# Patient Record
Sex: Female | Born: 1935 | Race: White | Hispanic: No
Health system: Southern US, Community
[De-identification: ages and names within clinical notes are randomized; demographics above are authoritative.]

---

## 1970-03-23 HISTORY — PX: BREAST EXCISIONAL BIOPSY: SUR124

## 2004-01-25 ENCOUNTER — Ambulatory Visit: Payer: Self-pay | Admitting: Gastroenterology

## 2004-05-07 ENCOUNTER — Ambulatory Visit: Payer: Self-pay | Admitting: Internal Medicine

## 2005-05-20 ENCOUNTER — Ambulatory Visit: Payer: Self-pay | Admitting: Internal Medicine

## 2006-01-27 ENCOUNTER — Ambulatory Visit: Payer: Self-pay | Admitting: Ophthalmology

## 2006-05-24 ENCOUNTER — Ambulatory Visit: Payer: Self-pay | Admitting: Internal Medicine

## 2007-07-20 ENCOUNTER — Ambulatory Visit: Payer: Self-pay | Admitting: Internal Medicine

## 2008-08-14 ENCOUNTER — Ambulatory Visit: Payer: Self-pay | Admitting: Internal Medicine

## 2008-08-22 ENCOUNTER — Emergency Department: Payer: Self-pay | Admitting: Internal Medicine

## 2009-09-16 ENCOUNTER — Ambulatory Visit: Payer: Self-pay | Admitting: Internal Medicine

## 2010-09-18 ENCOUNTER — Ambulatory Visit: Payer: Self-pay | Admitting: Internal Medicine

## 2010-10-28 ENCOUNTER — Ambulatory Visit: Payer: Self-pay | Admitting: Internal Medicine

## 2010-11-13 ENCOUNTER — Ambulatory Visit: Payer: Self-pay | Admitting: General Surgery

## 2010-11-14 LAB — PATHOLOGY REPORT

## 2011-09-21 ENCOUNTER — Ambulatory Visit: Payer: Self-pay | Admitting: Internal Medicine

## 2012-09-21 ENCOUNTER — Ambulatory Visit: Payer: Self-pay | Admitting: Internal Medicine

## 2013-08-11 ENCOUNTER — Emergency Department: Payer: Self-pay | Admitting: Emergency Medicine

## 2013-08-11 LAB — CBC WITH DIFFERENTIAL/PLATELET
BASOS ABS: 0 10*3/uL (ref 0.0–0.1)
Basophil %: 0.5 %
EOS PCT: 0.6 %
Eosinophil #: 0 10*3/uL (ref 0.0–0.7)
HCT: 42.3 % (ref 35.0–47.0)
HGB: 14.1 g/dL (ref 12.0–16.0)
Lymphocyte #: 0.8 10*3/uL — ABNORMAL LOW (ref 1.0–3.6)
Lymphocyte %: 12.4 %
MCH: 30.7 pg (ref 26.0–34.0)
MCHC: 33.2 g/dL (ref 32.0–36.0)
MCV: 93 fL (ref 80–100)
Monocyte #: 0.4 x10 3/mm (ref 0.2–0.9)
Monocyte %: 5.7 %
NEUTROS PCT: 80.8 %
Neutrophil #: 5.1 10*3/uL (ref 1.4–6.5)
PLATELETS: 201 10*3/uL (ref 150–440)
RBC: 4.58 10*6/uL (ref 3.80–5.20)
RDW: 14.3 % (ref 11.5–14.5)
WBC: 6.3 10*3/uL (ref 3.6–11.0)

## 2013-08-11 LAB — COMPREHENSIVE METABOLIC PANEL
ALK PHOS: 78 U/L
ALT: 18 U/L (ref 12–78)
Albumin: 3.1 g/dL — ABNORMAL LOW (ref 3.4–5.0)
Anion Gap: 4 — ABNORMAL LOW (ref 7–16)
BUN: 10 mg/dL (ref 7–18)
Bilirubin,Total: 0.6 mg/dL (ref 0.2–1.0)
CO2: 25 mmol/L (ref 21–32)
Calcium, Total: 8.4 mg/dL — ABNORMAL LOW (ref 8.5–10.1)
Chloride: 110 mmol/L — ABNORMAL HIGH (ref 98–107)
Creatinine: 0.62 mg/dL (ref 0.60–1.30)
EGFR (African American): >60
EGFR (Non-African Amer.): >60
Glucose: 107 mg/dL — ABNORMAL HIGH (ref 65–99)
Osmolality: 277 (ref 275–301)
POTASSIUM: 4.7 mmol/L (ref 3.5–5.1)
SGOT(AST): 51 U/L — ABNORMAL HIGH (ref 15–37)
Sodium: 139 mmol/L (ref 136–145)
TOTAL PROTEIN: 6.5 g/dL (ref 6.4–8.2)

## 2013-08-11 LAB — URINALYSIS, COMPLETE
Bacteria: NONE SEEN
Bilirubin,UR: NEGATIVE
GLUCOSE, UR: NEGATIVE mg/dL (ref 0–75)
Ketone: NEGATIVE
Leukocyte Esterase: NEGATIVE
Nitrite: NEGATIVE
PH: 7 (ref 4.5–8.0)
PROTEIN: NEGATIVE
RBC,UR: 1 /HPF (ref 0–5)
SPECIFIC GRAVITY: 1.008 (ref 1.003–1.030)
Squamous Epithelial: <1

## 2013-08-11 LAB — TROPONIN I: Troponin-I: <0.02 ng/mL

## 2013-08-11 LAB — LIPASE, BLOOD: Lipase: 140 U/L (ref 73–393)

## 2013-10-04 ENCOUNTER — Ambulatory Visit: Payer: Self-pay | Admitting: Ophthalmology

## 2013-10-19 ENCOUNTER — Ambulatory Visit: Payer: Self-pay | Admitting: Ophthalmology

## 2013-11-28 ENCOUNTER — Ambulatory Visit: Payer: Self-pay | Admitting: Internal Medicine

## 2014-07-06 ENCOUNTER — Other Ambulatory Visit: Payer: Self-pay | Admitting: Internal Medicine

## 2014-07-06 DIAGNOSIS — Z1239 Encounter for other screening for malignant neoplasm of breast: Secondary | ICD-10-CM

## 2014-07-14 NOTE — Op Note (Signed)
PATIENT NAME:  Courtney Bryant, Courtney Bryant MR#:  [id number] DATE OF BIRTH:  1934/02/06  DATE OF PROCEDURE:  10/19/2013  PREOPERATIVE DIAGNOSIS:  Senile cataract left eye.  POSTOPERATIVE DIAGNOSIS:  Senile cataract left eye.  PROCEDURE:  Phacoemulsification with posterior chamber intraocular lens implantation of the left eye.  LENS:   ZCBOO 19.5-diopter posterior chamber intraocular lens.  ULTRASOUND TIME:  14% of 1 minute, 14 seconds.  CDE 10.6.  SURGEON:  Mali Jozey Janco, MD  ANESTHESIA:  Topical with tetracaine drops and 2% Xylocaine jelly.  COMPLICATIONS:  None.  DESCRIPTION OF PROCEDURE:  The patient was identified in the holding room and transported to the operating room and placed in the supine position under the operating microscope.  The left eye was identified as the operative eye and it was prepped and draped in the usual sterile ophthalmic fashion.  A 1 millimeter clear-corneal paracentesis was made at the 1:30 position.  The anterior chamber was filled with Viscoat viscoelastic.  A 2.4 millimeter keratome was used to make a near-clear corneal incision at the 10:30 position.  A curvilinear capsulorrhexis was made with a cystotome and capsulorrhexis forceps.  Balanced salt solution was used to hydrodissect and hydrodelineate the nucleus.  Phacoemulsification was then used in stop and chop fashion to remove the lens nucleus and epinucleus.  The remaining cortex was then removed using the irrigation and aspiration handpiece. Provisc was then placed into the capsular bag to distend it for lens placement.  A ZCBOO 19.5-diopter lens was then injected into the capsular bag.  The remaining viscoelastic was aspirated.  Wounds were hydrated with balanced salt solution.  The anterior chamber was inflated to a physiologic pressure with balanced salt solution.  0.1 mL of cefuroxime 10 mg/mL were injected into the anterior chamber for a dose of 1 mg of intracameral antibiotic at the completion of the case.  Miostat was placed into the anterior chamber to constrict the pupil.  No wound leaks were noted.  Topical Vigamox drops and Maxitrol ointment were applied to the eye.  The patient was taken to the recovery room in stable condition without complications of anesthesia or surgery.   ____________________________ Wyonia Hough, MD crb:ts D: 10/19/2013 11:39:38 ET T: 10/19/2013 12:14:19 ET JOB#: [id number]  cc: Wyonia Hough, MD, <Dictator> Leandrew Koyanagi MD ELECTRONICALLY SIGNED 10/19/2013 13:09

## 2014-11-30 ENCOUNTER — Ambulatory Visit
Admission: RE | Admit: 2014-11-30 | Discharge: 2014-11-30 | Disposition: A | Discharge status: Home / Self Care | Payer: Medicare Other | Source: Ambulatory Visit | Attending: Internal Medicine | Admitting: Internal Medicine

## 2014-11-30 DIAGNOSIS — Z1231 Encounter for screening mammogram for malignant neoplasm of breast: Secondary | ICD-10-CM | POA: Insufficient documentation

## 2014-11-30 DIAGNOSIS — Z1239 Encounter for other screening for malignant neoplasm of breast: Secondary | ICD-10-CM

## 2015-09-26 ENCOUNTER — Other Ambulatory Visit: Payer: Self-pay | Admitting: Internal Medicine

## 2015-09-26 DIAGNOSIS — Z1231 Encounter for screening mammogram for malignant neoplasm of breast: Secondary | ICD-10-CM

## 2015-12-18 ENCOUNTER — Other Ambulatory Visit: Payer: Self-pay | Admitting: Internal Medicine

## 2015-12-18 ENCOUNTER — Ambulatory Visit
Admission: RE | Admit: 2015-12-18 | Discharge: 2015-12-18 | Disposition: A | Discharge status: Home / Self Care | Payer: Medicare Other | Source: Ambulatory Visit | Attending: Internal Medicine | Admitting: Internal Medicine

## 2015-12-18 DIAGNOSIS — Z1231 Encounter for screening mammogram for malignant neoplasm of breast: Secondary | ICD-10-CM | POA: Insufficient documentation

## 2017-05-27 ENCOUNTER — Other Ambulatory Visit: Payer: Self-pay | Admitting: Internal Medicine

## 2017-05-27 DIAGNOSIS — Z1231 Encounter for screening mammogram for malignant neoplasm of breast: Secondary | ICD-10-CM

## 2017-06-17 ENCOUNTER — Ambulatory Visit
Admission: RE | Admit: 2017-06-17 | Discharge: 2017-06-17 | Disposition: A | Discharge status: Home / Self Care | Payer: Medicare Other | Source: Ambulatory Visit | Attending: Internal Medicine | Admitting: Internal Medicine

## 2017-06-17 DIAGNOSIS — Z1231 Encounter for screening mammogram for malignant neoplasm of breast: Secondary | ICD-10-CM | POA: Diagnosis not present

## 2018-01-21 ENCOUNTER — Other Ambulatory Visit: Payer: Self-pay | Admitting: Podiatry

## 2018-01-21 DIAGNOSIS — T148XXA Other injury of unspecified body region, initial encounter: Secondary | ICD-10-CM

## 2018-01-21 DIAGNOSIS — IMO0002 Reserved for concepts with insufficient information to code with codable children: Secondary | ICD-10-CM

## 2018-02-08 ENCOUNTER — Ambulatory Visit
Admission: RE | Admit: 2018-02-08 | Discharge: 2018-02-08 | Disposition: A | Discharge status: Home / Self Care | Payer: Medicare Other | Source: Ambulatory Visit | Attending: Podiatry | Admitting: Podiatry

## 2018-02-08 DIAGNOSIS — S96912A Strain of unspecified muscle and tendon at ankle and foot level, left foot, initial encounter: Secondary | ICD-10-CM | POA: Insufficient documentation

## 2018-02-08 DIAGNOSIS — X58XXXA Exposure to other specified factors, initial encounter: Secondary | ICD-10-CM | POA: Insufficient documentation

## 2018-02-08 DIAGNOSIS — T148XXA Other injury of unspecified body region, initial encounter: Secondary | ICD-10-CM

## 2018-02-08 DIAGNOSIS — M659 Synovitis and tenosynovitis, unspecified: Secondary | ICD-10-CM | POA: Insufficient documentation

## 2018-02-08 DIAGNOSIS — IMO0002 Reserved for concepts with insufficient information to code with codable children: Secondary | ICD-10-CM

## 2018-06-01 ENCOUNTER — Other Ambulatory Visit: Payer: Self-pay | Admitting: Internal Medicine

## 2018-06-01 DIAGNOSIS — Z1231 Encounter for screening mammogram for malignant neoplasm of breast: Secondary | ICD-10-CM

## 2019-06-05 ENCOUNTER — Other Ambulatory Visit: Payer: Self-pay | Admitting: Internal Medicine

## 2019-06-05 DIAGNOSIS — Z1231 Encounter for screening mammogram for malignant neoplasm of breast: Secondary | ICD-10-CM

## 2019-07-31 ENCOUNTER — Ambulatory Visit
Admission: RE | Admit: 2019-07-31 | Discharge: 2019-07-31 | Disposition: A | Discharge status: Home / Self Care | Payer: Medicare Other | Source: Ambulatory Visit | Attending: Internal Medicine | Admitting: Internal Medicine

## 2019-07-31 DIAGNOSIS — Z1231 Encounter for screening mammogram for malignant neoplasm of breast: Secondary | ICD-10-CM | POA: Diagnosis not present

## 2020-06-24 ENCOUNTER — Other Ambulatory Visit: Payer: Self-pay | Admitting: Internal Medicine

## 2020-06-24 DIAGNOSIS — Z1231 Encounter for screening mammogram for malignant neoplasm of breast: Secondary | ICD-10-CM

## 2020-08-13 ENCOUNTER — Ambulatory Visit
Admission: RE | Admit: 2020-08-13 | Discharge: 2020-08-13 | Disposition: A | Discharge status: Home / Self Care | Payer: Medicare Other | Source: Ambulatory Visit | Attending: Internal Medicine | Admitting: Internal Medicine

## 2020-08-13 ENCOUNTER — Other Ambulatory Visit: Payer: Self-pay

## 2020-08-13 DIAGNOSIS — Z1231 Encounter for screening mammogram for malignant neoplasm of breast: Secondary | ICD-10-CM | POA: Diagnosis present

## 2021-03-31 IMAGING — MG DIGITAL SCREENING BILAT W/ TOMO W/ CAD
8 series · 8 of 24 positions shown · non-contrast
Comparison: Previous exam(s).

CLINICAL DATA: Screening.

EXAM:
DIGITAL SCREENING BILATERAL MAMMOGRAM WITH TOMO AND CAD

[R CC synth-2D]
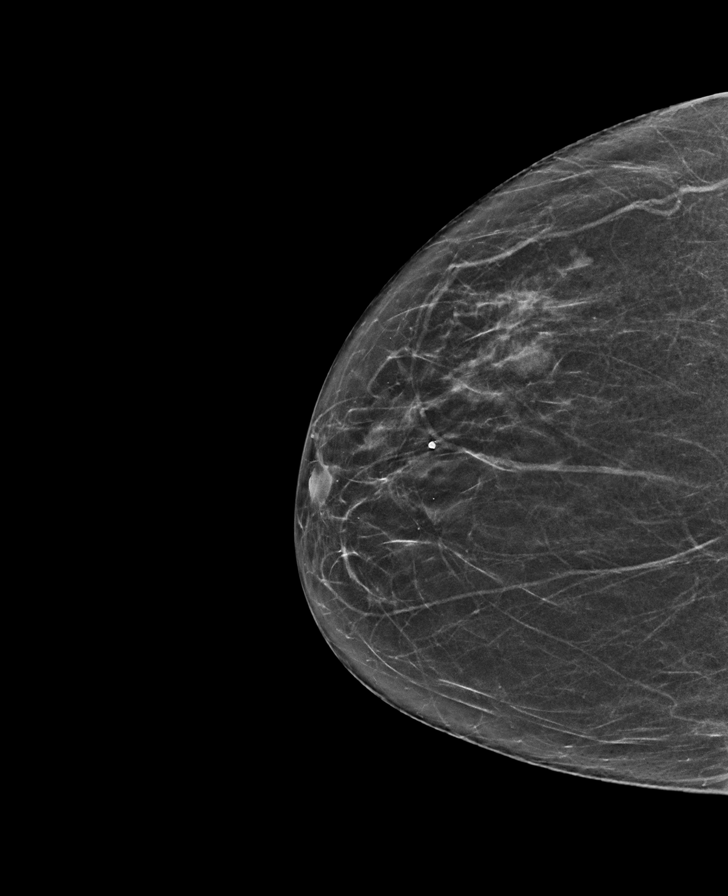

[L CC synth-2D]
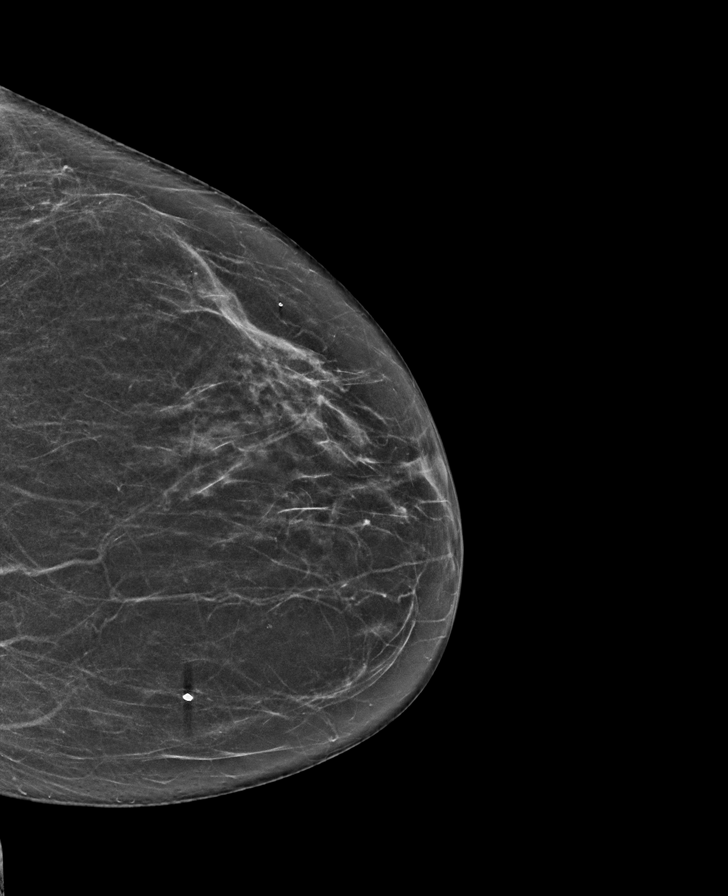

[L MLO synth-2D]
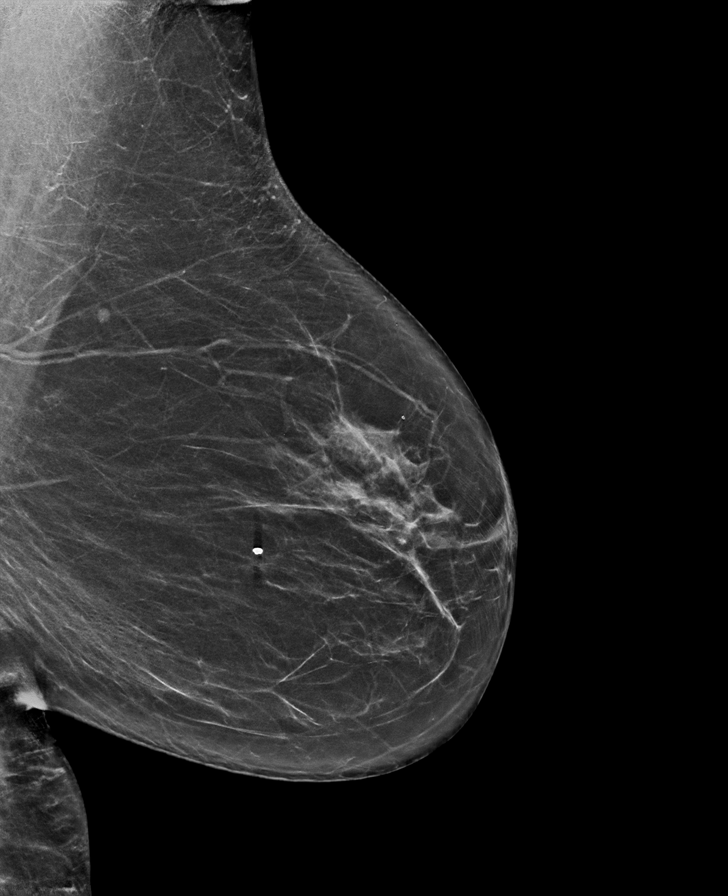

[R MLO synth-2D]
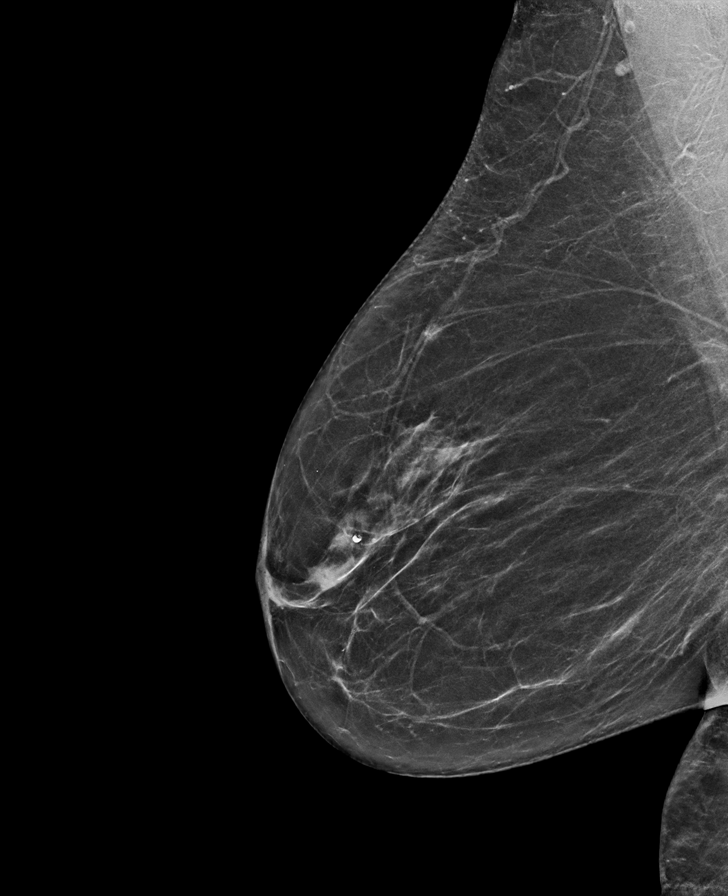

[R MLO tomo · tomo slice 32/63.0]
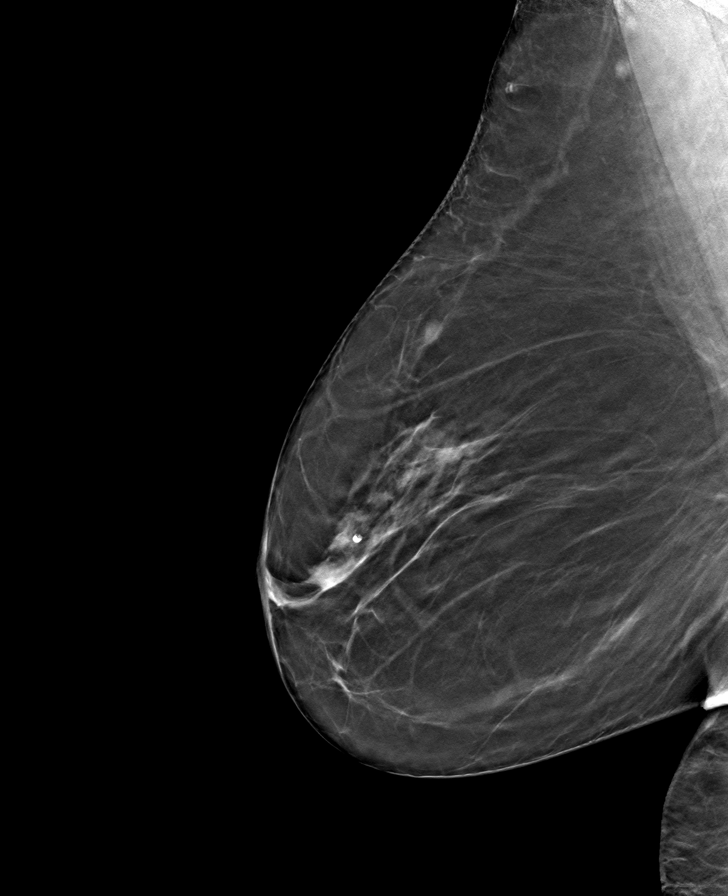

[L MLO tomo · tomo slice 36/71.0]
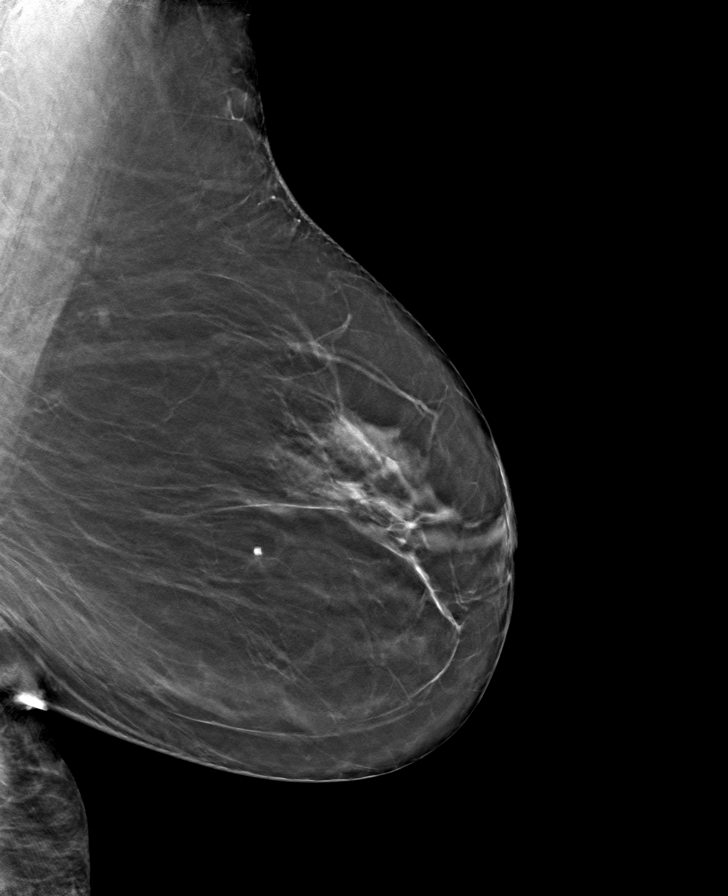

[R CC tomo · tomo slice 30/59.0]
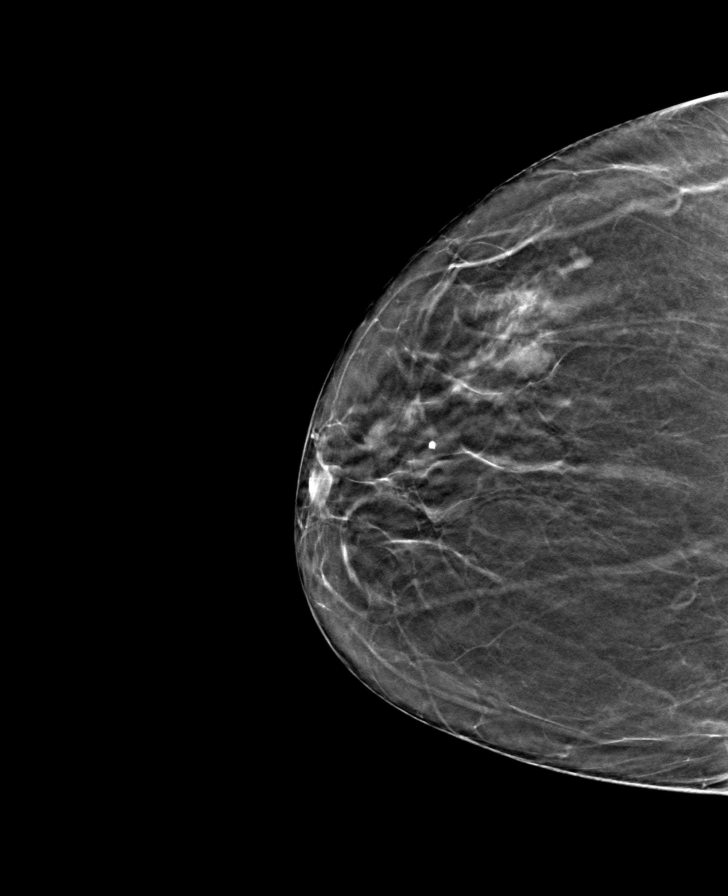

[L CC tomo · tomo slice 31/61.0]
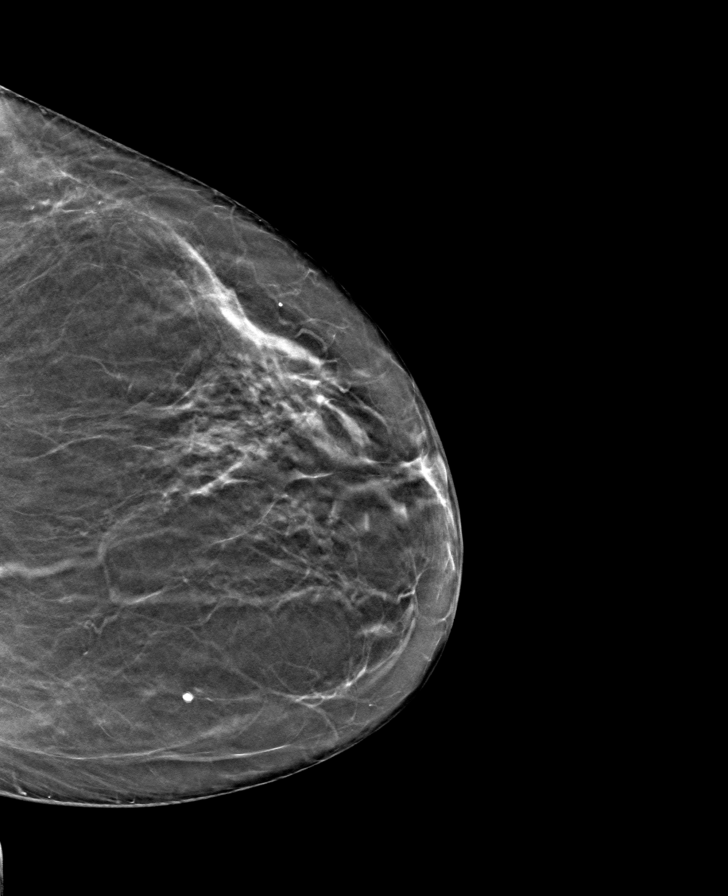

[8 of 24 positions shown; findings below may reference images not displayed]

ACR Breast Density Category b: There are scattered areas of
fibroglandular density.
FINDINGS: There are no findings suspicious for malignancy. Images were
processed with CAD.
IMPRESSION: No mammographic evidence of malignancy. A result letter of this
screening mammogram will be mailed directly to the patient.

RECOMMENDATION:
Screening mammogram in one year. (Code:[id number]-775)

BI-RADS CATEGORY  1: Negative.

## 2021-12-15 ENCOUNTER — Ambulatory Visit (INDEPENDENT_AMBULATORY_CARE_PROVIDER_SITE_OTHER): Payer: Medicare Other | Admitting: Dermatology

## 2021-12-15 DIAGNOSIS — Z1283 Encounter for screening for malignant neoplasm of skin: Secondary | ICD-10-CM

## 2021-12-15 DIAGNOSIS — L578 Other skin changes due to chronic exposure to nonionizing radiation: Secondary | ICD-10-CM | POA: Diagnosis not present

## 2021-12-15 DIAGNOSIS — L821 Other seborrheic keratosis: Secondary | ICD-10-CM

## 2021-12-15 DIAGNOSIS — L719 Rosacea, unspecified: Secondary | ICD-10-CM

## 2021-12-15 DIAGNOSIS — L920 Granuloma annulare: Secondary | ICD-10-CM | POA: Diagnosis not present

## 2021-12-15 DIAGNOSIS — D229 Melanocytic nevi, unspecified: Secondary | ICD-10-CM

## 2021-12-15 MED ORDER — METRONIDAZOLE 0.75 % EX CREA: TOPICAL_CREAM | Freq: Two times a day (BID) | CUTANEOUS | 0 refills | Status: DC

## 2021-12-15 NOTE — Progress Notes (Unsigned)
   New Patient Visit  Subjective  Courtney Bryant is a 88 y.o. female who presents for the following: Other (New patient - She has a spot on her right lower leg that needs to be check. She also has Rosacea and has used Metrogel but she would rather have cream than gel. The patient presents for Total-Body Skin Exam (TBSE) for skin cancer screening and mole check.  The patient has spots, moles and lesions to be evaluated, some may be new or changing and the patient has concerns that these could be cancer./).  Accompanied by daughter  The following portions of the chart were reviewed this encounter and updated as appropriate:   Allergies  Meds  Problems  Med Hx  Surg Hx  Fam Hx     Review of Systems:  No other skin or systemic complaints except as noted in HPI or Assessment and Plan.  Objective  Well appearing patient in no apparent distress; mood and affect are within normal limits.  A full examination was performed including scalp, head, eyes, ears, nose, lips, neck, chest, axillae, abdomen, back, buttocks, bilateral upper extremities, bilateral lower extremities, hands, feet, fingers, toes, fingernails, and toenails. All findings within normal limits unless otherwise noted below.  Face Pinkness  lower legs Annular pink patch        Assessment & Plan  Rosacea Face Rosacea is a chronic progressive skin condition usually affecting the face of adults, causing redness and/or acne bumps. It is treatable but not curable. It sometimes affects the eyes (ocular rosacea) as well. It may respond to topical and/or systemic medication and can flare with stress, sun exposure, alcohol, exercise, topical steroids (including hydrocortisone/cortisone 10) and some foods.  Daily application of broad spectrum spf 30+ sunscreen to face is recommended to reduce flares  Metrocream 0.75% qhs  metroNIDAZOLE (METROCREAM) 0.75 % cream - Face Apply topically 2 (two) times daily.  Granuloma  annulare lower legs GA vs NLD Benign-appearing.  Observation.  Call clinic for new or changing lesions.  Recommend daily use of broad spectrum spf 30+ sunscreen to sun-exposed areas. No treatment recommended at this time Pt declines treatment today.  Actinic Damage - chronic, secondary to cumulative UV radiation exposure/sun exposure over time - diffuse scaly erythematous macules with underlying dyspigmentation - Recommend daily broad spectrum sunscreen SPF 30+ to sun-exposed areas, reapply every 2 hours as needed.  - Recommend staying in the shade or wearing long sleeves, sun glasses (UVA+UVB protection) and wide brim hats (4-inch brim around the entire circumference of the hat). - Call for new or changing lesions.  Seborrheic Keratoses - Stuck-on, waxy, tan-brown papules and/or plaques  - Benign-appearing - Discussed benign etiology and prognosis. - Observe - Call for any changes  Melanocytic Nevi - Tan-brown and/or pink-flesh-colored symmetric macules and papules - Benign appearing on exam today - Observation - Call clinic for new or changing moles - Recommend daily use of broad spectrum spf 30+ sunscreen to sun-exposed areas.   Return in about 6 months (around 06/15/2022) for Rosacea follow up, recheck GA.   I, Ashok Cordia, CMA, am acting as scribe for Sarina Ser, MD . Documentation: I have reviewed the above documentation for accuracy and completeness, and I agree with the above.  Sarina Ser, MD

## 2021-12-15 NOTE — Patient Instructions (Addendum)
Instructions for [facility] Medications  One or more of your medications was sent to the [facility] mail order compounding pharmacy. You will receive an email from them and can purchase the medicine through that link. It will then be mailed to your home at the address you confirmed. If for any reason you do not receive an email from them, please check your spam folder. If you still do not find the email, please let us know. [facility] phone number is [phone].       Due to recent changes in healthcare laws, you may see results of your pathology and/or laboratory studies on MyChart before the doctors have had a chance to review them. We understand that in some cases there may be results that are confusing or concerning to you. Please understand that not all results are received at the same time and often the doctors may need to interpret multiple results in order to provide you with the best plan of care or course of treatment. Therefore, we ask that you please give us 2 business days to thoroughly review all your results before contacting the office for clarification. Should we see a critical lab result, you will be contacted sooner.   If You Need Anything After Your Visit  If you have any questions or concerns for your doctor, please call our main line at [phone] and press option 4 to reach your doctor's medical assistant. If no one answers, please leave a voicemail as directed and we will return your call as soon as possible. Messages left after 4 pm will be answered the following business day.   You may also send us a message via MyChart. We typically respond to MyChart messages within 1-2 business days.  For prescription refills, please ask your pharmacy to contact our office. Our fax number is [phone].  If you have an urgent issue when the clinic is closed that cannot wait until the next business day, you can page your doctor at the number below.    Please note  that while we do our best to be available for urgent issues outside of office hours, we are not available 24/7.   If you have an urgent issue and are unable to reach us, you may choose to seek medical care at your doctor's office, retail clinic, urgent care center, or emergency room.  If you have a medical emergency, please immediately call 911 or go to the emergency department.  Pager Numbers  - Dr. Kowalski: [phone]  - Dr. Moye: [phone]  - Dr. Stewart: [phone]  In the event of inclement weather, please call our main line at [phone] for an update on the status of any delays or closures.  Dermatology Medication Tips: Please keep the boxes that topical medications come in in order to help keep track of the instructions about where and how to use these. Pharmacies typically print the medication instructions only on the boxes and not directly on the medication tubes.   If your medication is too expensive, please contact our office at [phone] option 4 or send us a message through MyChart.   We are unable to tell what your co-pay for medications will be in advance as this is different depending on your insurance coverage. However, we may be able to find a substitute medication at lower cost or fill out paperwork to get insurance to cover a needed medication.   If a prior authorization is required to get your medication covered by your insurance   company, please allow us 1-2 business days to complete this process.  Drug prices often vary depending on where the prescription is filled and some pharmacies may offer cheaper prices.  The website www.goodrx.com contains coupons for medications through different pharmacies. The prices here do not account for what the cost may be with help from insurance (it may be cheaper with your insurance), but the website can give you the price if you did not use any insurance.  - You can print the associated coupon and take it with your  prescription to the pharmacy.  - You may also stop by our office during regular business hours and pick up a GoodRx coupon card.  - If you need your prescription sent electronically to a different pharmacy, notify our office through [facility] MyChart or by phone at [phone] option 4.     Si Usted Necesita Algo Despus de Su Visita  Tambin puede enviarnos un mensaje a travs de MyChart. Por lo general respondemos a los mensajes de MyChart en el transcurso de 1 a 2 das hbiles.  Para renovar recetas, por favor pida a su farmacia que se ponga en contacto con nuestra oficina. Nuestro nmero de fax es el [phone].  Si tiene un asunto urgente cuando la clnica est cerrada y que no puede esperar hasta el siguiente da hbil, puede llamar/localizar a su doctor(a) al nmero que aparece a continuacin.   Por favor, tenga en cuenta que aunque hacemos todo lo posible para estar disponibles para asuntos urgentes fuera del horario de oficina, no estamos disponibles las 24 horas del da, los 7 das de la semana.   Si tiene un problema urgente y no puede comunicarse con nosotros, puede optar por buscar atencin mdica  en el consultorio de su doctor(a), en una clnica privada, en un centro de atencin urgente o en una sala de emergencias.  Si tiene una emergencia mdica, por favor llame inmediatamente al 911 o vaya a la sala de emergencias.  Nmeros de bper  - Dr. Kowalski: [phone]  - Dra. Moye: [phone]  - Dra. Stewart: [phone]  En caso de inclemencias del tiempo, por favor llame a nuestra lnea principal al [phone] para una actualizacin sobre el estado de cualquier retraso o cierre.  Consejos para la medicacin en dermatologa: Por favor, guarde las cajas en las que vienen los medicamentos de uso tpico para ayudarle a seguir las instrucciones sobre dnde y cmo usarlos. Las farmacias generalmente imprimen las instrucciones del medicamento slo en las cajas y no  directamente en los tubos del medicamento.   Si su medicamento es muy caro, por favor, pngase en contacto con nuestra oficina llamando al [phone] y presione la opcin 4 o envenos un mensaje a travs de MyChart.   No podemos decirle cul ser su copago por los medicamentos por adelantado ya que esto es diferente dependiendo de la cobertura de su seguro. Sin embargo, es posible que podamos encontrar un medicamento sustituto a menor costo o llenar un formulario para que el seguro cubra el medicamento que se considera necesario.   Si se requiere una autorizacin previa para que su compaa de seguros cubra su medicamento, por favor permtanos de 1 a 2 das hbiles para completar este proceso.  Los precios de los medicamentos varan con frecuencia dependiendo del lugar de dnde se surte la receta y alguna farmacias pueden ofrecer precios ms baratos.  El sitio web www.goodrx.com tiene cupones para medicamentos de diferentes farmacias. Los precios aqu no tienen en cuenta   lo que podra costar con la ayuda del seguro (puede ser ms barato con su seguro), pero el sitio web puede darle el precio si no utiliz ningn seguro.  - Puede imprimir el cupn correspondiente y llevarlo con su receta a la farmacia.  - Tambin puede pasar por nuestra oficina durante el horario de atencin regular y recoger una tarjeta de cupones de GoodRx.  - Si necesita que su receta se enve electrnicamente a una farmacia diferente, informe a nuestra oficina a travs de MyChart de [place] o por telfono llamando al [phone] y presione la opcin 4.  

## 2021-12-17 ENCOUNTER — Encounter: Payer: Self-pay | Admitting: Dermatology

## 2022-06-15 ENCOUNTER — Ambulatory Visit: Payer: Medicare Other | Admitting: Dermatology

## 2022-12-14 ENCOUNTER — Ambulatory Visit (INDEPENDENT_AMBULATORY_CARE_PROVIDER_SITE_OTHER): Payer: Medicare Other | Admitting: Dermatology

## 2022-12-14 DIAGNOSIS — L719 Rosacea, unspecified: Secondary | ICD-10-CM | POA: Diagnosis not present

## 2022-12-14 MED ORDER — METRONIDAZOLE 0.75 % EX CREA: TOPICAL_CREAM | CUTANEOUS | 11 refills | Status: DC

## 2022-12-14 NOTE — Patient Instructions (Addendum)
Rosacea  What is rosacea? Rosacea (say: ro-zay-sha) is a common skin disease that usually begins as a trend of flushing or blushing easily.  As rosacea progresses, a persistent redness in the center of the face will develop and may gradually spread beyond the nose and cheeks to the forehead and chin.  In some cases, the ears, chest, and back could be affected.  Rosacea may appear as tiny blood vessels or small red bumps that occur in crops.  Frequently they can contain pus, and are called "pustules".  If the bumps do not contain pus, they are referred to as "papules".  Rarely, in prolonged, untreated cases of rosacea, the oil glands of the nose and cheeks may become permanently enlarged.  This is called rhinophyma, and is seen more frequently in men.  Signs and Risks In its beginning stages, rosacea tends to come and go, which makes it difficult to recognize.  It can start as intermittent flushing of the face.  Eventually, blood vessels may become permanently visible.  Pustules and papules can appear, but can be mistaken for adult acne.  People of all races, ages, genders and ethnic groups are at risk of developing rosacea.  However, it is more common in women (especially around menopause) and adults with fair skin between the ages of 31 and 18.  Treatment Dermatologists typically recommend a combination of treatments to effectively manage rosacea.  Treatment can improve symptoms and may stop the progression of the rosacea.  Treatment may involve both topical and oral medications.  The tetracycline antibiotics are often used for their anti-inflammatory effect; however, because of the possibility of developing antibiotic resistance, they should not be used long term at full dose.  For dilated blood vessels the options include electrodessication (uses electric current through a small needle), laser treatment, and cosmetics to hide the redness.   With all forms of treatment, improvement is a slow process, and  patients may not see any results for the first 3-4 weeks.  It is very important to avoid the sun and other triggers.  Patients must wear sunscreen daily.  Skin Care Instructions: Cleanse the skin with a mild soap such as CeraVe cleanser, Cetaphil cleanser, or Dove soap once or twice daily as needed. Moisturize with Eucerin Redness Relief Daily Perfecting Lotion (has a subtle green tint), CeraVe Moisturizing Cream, or Oil of Olay Daily Moisturizer with sunscreen every morning and/or night as recommended. Makeup should be "non-comedogenic" (won't clog pores) and be labeled "for sensitive skin". Good choices for cosmetics are: Neutrogena, Almay, and Physician's Formula.  Any product with a green tint tends to offset a red complexion. If your eyes are dry and irritated, use artificial tears 2-3 times per day and cleanse the eyelids daily with baby shampoo.  Have your eyes examined at least every 2 years.  Be sure to tell your eye doctor that you have rosacea. Alcoholic beverages tend to cause flushing of the skin, and may make rosacea worse. Always wear sunscreen, protect your skin from extreme hot and cold temperatures, and avoid spicy foods, hot drinks, and mechanical irritation such as rubbing, scrubbing, or massaging the face.  Avoid harsh skin cleansers, cleansing masks, astringents, and exfoliation. If a particular product burns or makes your face feel tight, then it is likely to flare your rosacea. If you are having difficulty finding a sunscreen that you can tolerate, you may try switching to a chemical-free sunscreen.  These are ones whose active ingredient is zinc oxide or titanium dioxide  only.  They should also be fragrance free, non-comedogenic, and labeled for sensitive skin. Rosacea triggers may vary from person to person.  There are a variety of foods that have been reported to trigger rosacea.  Some patients find that keeping a diary of what they were doing when they flared helps them avoid  triggers.   Due to recent changes in healthcare laws, you may see results of your pathology and/or laboratory studies on MyChart before the doctors have had a chance to review them. We understand that in some cases there may be results that are confusing or concerning to you. Please understand that not all results are received at the same time and often the doctors may need to interpret multiple results in order to provide you with the best plan of care or course of treatment. Therefore, we ask that you please give [place] 2 business days to thoroughly review all your results before contacting the office for clarification. Should we see a critical lab result, you will be contacted sooner.   If You Need Anything After Your Visit  If you have any questions or concerns for your doctor, please call our main line at [phone] and press option 4 to reach your doctor's medical assistant. If no one answers, please leave a voicemail as directed and we will return your call as soon as possible. Messages left after 4 pm will be answered the following business day.   You may also send [place] a message via MyChart. We typically respond to MyChart messages within 1-2 business days.  For prescription refills, please ask your pharmacy to contact our office. Our fax number is [phone].  If you have an urgent issue when the clinic is closed that cannot wait until the next business day, you can page your doctor at the number below.    Please note that while we do our best to be available for urgent issues outside of office hours, we are not available 24/7.   If you have an urgent issue and are unable to reach [place], you may choose to seek medical care at your doctor's office, retail clinic, urgent care center, or emergency room.  If you have a medical emergency, please immediately call 911 or go to the emergency department.  Pager Numbers  - Dr. Gwen Pounds: [phone]  - Dr. Roseanne Reno: [phone]  - Dr. Katrinka Blazing:  [phone]   In the event of inclement weather, please call our main line at [phone] for an update on the status of any delays or closures.  Dermatology Medication Tips: Please keep the boxes that topical medications come in in order to help keep track of the instructions about where and how to use these. Pharmacies typically print the medication instructions only on the boxes and not directly on the medication tubes.   If your medication is too expensive, please contact our office at [phone] option 4 or send [place] a message through MyChart.   We are unable to tell what your co-pay for medications will be in advance as this is different depending on your insurance coverage. However, we may be able to find a substitute medication at lower cost or fill out paperwork to get insurance to cover a needed medication.   If a prior authorization is required to get your medication covered by your insurance company, please allow [place] 1-2 business days to complete this process.  Drug prices often vary depending on where the prescription is filled and some pharmacies may offer cheaper prices.  The website www.goodrx.com contains coupons for medications through different pharmacies. The prices here do not account for what the cost may be with help from insurance (it may be cheaper with your insurance), but the website can give you the price if you did not use any insurance.  - You can print the associated coupon and take it with your prescription to the pharmacy.  - You may also stop by our office during regular business hours and pick up a GoodRx coupon card.  - If you need your prescription sent electronically to a different pharmacy, notify our office through [facility] or by phone at [phone] option 4.     Si Usted Necesita Algo Despus de Su Visita  Tambin puede enviarnos un mensaje a travs de Clinical cytogeneticist. Por lo general respondemos a los mensajes de MyChart en el transcurso de 1 a  2 das hbiles.  Para renovar recetas, por favor pida a su farmacia que se ponga en contacto con nuestra oficina. Annie Sable de fax es [place] [phone].  Si tiene un asunto urgente cuando la clnica est cerrada y que no puede esperar hasta el siguiente da hbil, puede llamar/localizar a su doctor(a) al nmero que aparece a continuacin.   Por favor, tenga en cuenta que aunque hacemos todo lo posible para estar disponibles para asuntos urgentes fuera del horario de [place], no estamos disponibles las 24 horas del da, los 7 [address]  [address] de la [place].   Si tiene un problema urgente y no puede comunicarse con nosotros, puede optar por buscar atencin mdica  en el consultorio de su doctor(a), en una clnica privada, en un centro de atencin urgente o en una sala de emergencias.  Si tiene Engineer, drilling, por favor llame inmediatamente al 911 o vaya a la sala de emergencias.  Nmeros de bper  - Dr. Gwen Pounds: [phone]  - Dra. Roseanne Reno: [phone]  - Dr. Katrinka Blazing: [phone]   En caso de inclemencias del tiempo, por favor llame a Lacy Duverney principal al [phone] para una actualizacin sobre el [place] de cualquier retraso o cierre.  Consejos para la medicacin en dermatologa: Por favor, guarde las cajas en las que vienen los medicamentos de uso tpico para ayudarle a seguir las instrucciones sobre dnde y cmo usarlos. Las farmacias generalmente imprimen las instrucciones del medicamento slo en las cajas y no directamente en los tubos del [place].   Si su medicamento es muy caro, por favor, pngase en contacto con Rolm Gala llamando al [phone] y presione la opcin 4 o envenos un mensaje a travs de Clinical cytogeneticist.   No podemos decirle cul ser su copago por los medicamentos por adelantado ya que esto es diferente dependiendo de la cobertura de su seguro. Sin embargo, es posible que podamos encontrar un medicamento sustituto a Audiological scientist un formulario para que  el seguro cubra el medicamento que se considera necesario.   Si se requiere una autorizacin previa para que su compaa de seguros [place] su medicamento, por favor permtanos de 1 a 2 das hbiles para completar [address]  Los precios de los medicamentos varan con frecuencia dependiendo del Environmental consultant de dnde se surte la receta y alguna farmacias pueden ofrecer precios ms baratos.  El sitio web www.goodrx.com tiene cupones para medicamentos de Health and safety inspector. Los precios aqu no tienen en cuenta lo que podra costar con la ayuda del seguro (puede ser ms barato con su seguro), pero el sitio web puede darle el precio si no utiliz Tourist information centre manager.  -  Puede imprimir el cupn correspondiente y llevarlo con su receta a la farmacia.  - Tambin puede pasar por nuestra oficina durante el horario de atencin regular y Education officer, museum una tarjeta de cupones de GoodRx.  - Si necesita que su receta se enve electrnicamente a una farmacia diferente, informe a nuestra oficina a travs de MyChart de [place] o por telfono llamando al [phone] y presione la opcin 4.

## 2022-12-14 NOTE — Progress Notes (Signed)
   Follow-Up Visit   Subjective  Courtney Bryant is a 89 y.o. female who presents for the following: Rosacea of the face. She is controlled using metronidazole 0.75% cream. She flares when she gets in the sun and forgets to put on her hat.  The following portions of the chart were reviewed this encounter and updated as appropriate: medications, allergies, medical history  Review of Systems:  No other skin or systemic complaints except as noted in HPI or Assessment and Plan.  Objective  Well appearing patient in no apparent distress; mood and affect are within normal limits.  Areas Examined: face  Relevant physical exam findings are noted in the Assessment and Plan.    Assessment & Plan   Rosacea  Related Medications metroNIDAZOLE (METROCREAM) 0.75 % cream Apply to affected areas face once a day for rosacea, twice daily for flares.    ROSACEA  Exam:  Erythema with small pink papules on the forehead, malar cheeks, nose.  Chronic and persistent condition with duration or expected duration over one year. Condition is symptomatic/ bothersome to patient. Not currently at goal.   Rosacea is a chronic progressive skin condition usually affecting the face of adults, causing redness and/or acne bumps. It is treatable but not curable. It sometimes affects the eyes (ocular rosacea) as well. It may respond to topical and/or systemic medication and can flare with stress, sun exposure, alcohol, exercise, topical steroids (including hydrocortisone/cortisone 10) and some foods.  Daily application of broad spectrum spf 30+ sunscreen to face is recommended to reduce flares.  Treatment Plan Continue metronidazole 0.75% cream once to twice daily dsp 45g 1 yr Rf. Discussed adding additional treatment if she has frequent flares (low dose doxy), pt defers at this time. Recommend starting daily moisturizer with sunscreen to face spf 30+  Return in about 1 year (around 12/14/2023) for Rosacea.  ICherlyn Labella, CMA, am acting as scribe for Willeen Niece, MD .   Documentation: I have reviewed the above documentation for accuracy and completeness, and I agree with the above.  Willeen Niece, MD

## 2023-03-18 ENCOUNTER — Ambulatory Visit
Admission: RE | Admit: 2023-03-18 | Discharge: 2023-03-18 | Disposition: A | Discharge status: Home / Self Care | Payer: Medicare Other | Source: Ambulatory Visit | Attending: Internal Medicine | Admitting: Internal Medicine

## 2023-03-18 ENCOUNTER — Other Ambulatory Visit: Payer: Self-pay | Admitting: Internal Medicine

## 2023-03-18 DIAGNOSIS — M79605 Pain in left leg: Secondary | ICD-10-CM | POA: Diagnosis present

## 2023-12-27 ENCOUNTER — Ambulatory Visit: Payer: Medicare Other | Admitting: Dermatology

## 2023-12-27 ENCOUNTER — Encounter: Payer: Self-pay | Admitting: Dermatology

## 2023-12-27 DIAGNOSIS — L719 Rosacea, unspecified: Secondary | ICD-10-CM | POA: Diagnosis not present

## 2023-12-27 MED ORDER — METRONIDAZOLE 0.75 % EX CREA: TOPICAL_CREAM | CUTANEOUS | 11 refills | Status: AC

## 2023-12-27 NOTE — Patient Instructions (Addendum)
 Increase Metronidazole  cream 0.75% to every night, if flaring may use twice a day Recommend sunscreen every morning  Due to recent changes in healthcare laws, you may see results of your pathology and/or laboratory studies on MyChart before the doctors have had a chance to review them. We understand that in some cases there may be results that are confusing or concerning to you. Please understand that not all results are received at the same time and often the doctors may need to interpret multiple results in order to provide you with the best plan of care or course of treatment. Therefore, we ask that you please give us  2 business days to thoroughly review all your results before contacting the office for clarification. Should we see a critical lab result, you will be contacted sooner.   If You Need Anything After Your Visit  If you have any questions or concerns for your doctor, please call our main line at [phone] and press option 4 to reach your doctor's medical assistant. If no one answers, please leave a voicemail as directed and we will return your call as soon as possible. Messages left after 4 pm will be answered the following business day.   You may also send us  a message via MyChart. We typically respond to MyChart messages within 1-2 business days.  For prescription refills, please ask your pharmacy to contact our office. Our fax number is [phone].  If you have an urgent issue when the clinic is closed that cannot wait until the next business day, you can page your doctor at the number below.    Please note that while we do our best to be available for urgent issues outside of office hours, we are not available 24/7.   If you have an urgent issue and are unable to reach us , you may choose to seek medical care at your doctor's office, retail clinic, urgent care center, or emergency room.  If you have a medical emergency, please immediately call 911 or go to the emergency  department.  Pager Numbers  - Dr. Hester: [phone]  - Dr. Jackquline: [phone]  - Dr. Claudene: [phone]   - Dr. Raymund: [phone]  In the event of inclement weather, please call our main line at [phone] for an update on the status of any delays or closures.  Dermatology Medication Tips: Please keep the boxes that topical medications come in in order to help keep track of the instructions about where and how to use these. Pharmacies typically print the medication instructions only on the boxes and not directly on the medication tubes.   If your medication is too expensive, please contact our office at [phone] option 4 or send us  a message through MyChart.   We are unable to tell what your co-pay for medications will be in advance as this is different depending on your insurance coverage. However, we may be able to find a substitute medication at lower cost or fill out paperwork to get insurance to cover a needed medication.   If a prior authorization is required to get your medication covered by your insurance company, please allow us  1-2 business days to complete this process.  Drug prices often vary depending on where the prescription is filled and some pharmacies may offer cheaper prices.  The website www.goodrx.com contains coupons for medications through different pharmacies. The prices here do not account for what the cost may be with help from insurance (it may be cheaper with your insurance), but the  website can give you the price if you did not use any insurance.  - You can print the associated coupon and take it with your prescription to the pharmacy.  - You may also stop by our office during regular business hours and pick up a GoodRx coupon card.  - If you need your prescription sent electronically to a different pharmacy, notify our office through [facility] or by phone at [phone] option 4.     Si Usted Necesita Algo Despus de Su  Visita  Tambin puede enviarnos un mensaje a travs de Clinical cytogeneticist. Por lo general respondemos a los mensajes de MyChart en el transcurso de 1 a 2 das hbiles.  Para renovar recetas, por favor pida a su farmacia que se ponga en contacto con nuestra oficina. Randi lakes de fax es [place] [phone].  Si tiene un asunto urgente cuando la clnica est cerrada y que no puede esperar hasta el siguiente da hbil, puede llamar/localizar a su doctor(a) al nmero que aparece a continuacin.   Por favor, tenga en cuenta que aunque hacemos todo lo posible para estar disponibles para asuntos urgentes fuera del horario de [place], no estamos disponibles las 24 horas del da, los 7 [address]  [address] de la [place].   Si tiene un problema urgente y no puede comunicarse con nosotros, puede optar por buscar atencin mdica  en el consultorio de su doctor(a), en una clnica privada, en un centro de atencin urgente o en una sala de emergencias.  Si tiene Engineer, drilling, por favor llame inmediatamente al 911 o vaya a la sala de emergencias.  Nmeros de bper  - Dr. Hester: [phone]  - Dra. Jackquline: [phone]  - Dr. Claudene: [phone]  - Dra. Kitts: [phone]  En caso de inclemencias del [place], por favor llame a nuestra lnea principal al [phone] para una actualizacin sobre el [place] de cualquier retraso o cierre.  Consejos para la medicacin en dermatologa: Por favor, guarde las cajas en las que vienen los medicamentos de uso tpico para ayudarle a seguir las instrucciones sobre dnde y cmo usarlos. Las farmacias generalmente imprimen las instrucciones del medicamento slo en las cajas y no directamente en los tubos del [place].   Si su medicamento es muy caro, por favor, pngase en contacto con landry rieger llamando al [phone] y presione la opcin 4 o envenos un mensaje a travs de Clinical cytogeneticist.   No podemos decirle cul ser su copago por los medicamentos por adelantado ya que esto  es diferente dependiendo de la cobertura de su seguro. Sin embargo, es posible que podamos encontrar un medicamento sustituto a Audiological scientist un formulario para que el seguro cubra el medicamento que se considera necesario.   Si se requiere una autorizacin previa para que su compaa de seguros malta su medicamento, por favor permtanos de 1 a 2 das hbiles para completar este proceso.  Los precios de los medicamentos varan con frecuencia dependiendo del Environmental consultant de dnde se surte la receta y alguna farmacias pueden ofrecer precios ms baratos.  El sitio web www.goodrx.com tiene cupones para medicamentos de Health and safety inspector. Los precios aqu no tienen en cuenta lo que podra costar con la ayuda del seguro (puede ser ms barato con su seguro), pero el sitio web puede darle el precio si no utiliz Tourist information centre manager.  - Puede imprimir el cupn correspondiente y llevarlo con su receta a la farmacia.  - Tambin puede pasar por nuestra oficina durante el horario de atencin regular y Librarian, academic  tarjeta de cupones de GoodRx.  - Si necesita que su receta se enve electrnicamente a una farmacia diferente, informe a nuestra oficina a travs de MyChart de [place] o por telfono llamando al [phone] y presione la opcin 4.

## 2023-12-27 NOTE — Progress Notes (Signed)
   Follow-Up Visit   Subjective  Courtney Bryant is a 89 y.o. female who presents for the following: Rosacea face, Metrocream  0.75% as needed for breakouts.  Continues to have flares off and on.   The following portions of the chart were reviewed this encounter and updated as appropriate: medications, allergies, medical history  Review of Systems:  No other skin or systemic complaints except as noted in HPI or Assessment and Plan.  Objective  Well appearing patient in no apparent distress; mood and affect are within normal limits.   A focused examination was performed of the following areas: face  Relevant exam findings are noted in the Assessment and Plan.    Assessment & Plan   ROSACEA face Exam: Redness, erythema mid face, pink papules malar cheeks, glabella  Chronic and persistent condition with duration or expected duration over one year. Condition is symptomatic/ bothersome to patient. Not currently at goal.  Rosacea is a chronic progressive skin condition usually affecting the face of adults, causing redness and/or acne bumps. It is treatable but not curable. It sometimes affects the eyes (ocular rosacea) as well. It may respond to topical and/or systemic medication and can flare with stress, sun exposure, alcohol, exercise, topical steroids (including hydrocortisone/cortisone 10) and some foods.  Daily application of broad spectrum spf 30+ sunscreen to face is recommended to reduce flares.  Treatment Plan Cont Metrocream  0.75% increase to at bedtime for maintenance, and twice daily with flares Recommend SPF qam  ROSACEA   Related Medications metroNIDAZOLE  (METROCREAM ) 0.75 % cream Apply to face qhs for Rosacea, may increase to bid prn flares  Return in about 1 year (around 12/26/2024) for Rosacea.  I, Grayce Saunas, RMA, am acting as scribe for Rexene Rattler, MD .   Documentation: I have reviewed the above documentation for accuracy and completeness, and I agree  with the above.  Rexene Rattler, MD

## 2025-01-01 ENCOUNTER — Encounter: Admitting: Dermatology
